# Patient Record
Sex: Male | Born: 1935 | Race: White | Hispanic: No | Smoking: Former smoker
Health system: Southern US, Community
[De-identification: ages and names within clinical notes are randomized; demographics above are authoritative.]

## PROBLEM LIST (undated history)

## (undated) DIAGNOSIS — H3530 Unspecified macular degeneration: Secondary | ICD-10-CM

## (undated) DIAGNOSIS — E785 Hyperlipidemia, unspecified: Secondary | ICD-10-CM

## (undated) DIAGNOSIS — C801 Malignant (primary) neoplasm, unspecified: Secondary | ICD-10-CM

## (undated) DIAGNOSIS — N390 Urinary tract infection, site not specified: Secondary | ICD-10-CM

## (undated) DIAGNOSIS — I10 Essential (primary) hypertension: Secondary | ICD-10-CM

## (undated) DIAGNOSIS — R3129 Other microscopic hematuria: Secondary | ICD-10-CM

## (undated) DIAGNOSIS — R972 Elevated prostate specific antigen [PSA]: Secondary | ICD-10-CM

## (undated) DIAGNOSIS — G459 Transient cerebral ischemic attack, unspecified: Secondary | ICD-10-CM

## (undated) DIAGNOSIS — I4891 Unspecified atrial fibrillation: Secondary | ICD-10-CM

## (undated) DIAGNOSIS — M1990 Unspecified osteoarthritis, unspecified site: Secondary | ICD-10-CM

## (undated) DIAGNOSIS — N400 Enlarged prostate without lower urinary tract symptoms: Secondary | ICD-10-CM

## (undated) DIAGNOSIS — B029 Zoster without complications: Secondary | ICD-10-CM

## (undated) HISTORY — DX: Other microscopic hematuria: R31.29

## (undated) HISTORY — DX: Urinary tract infection, site not specified: N39.0

## (undated) HISTORY — PX: APPENDECTOMY: SHX54

## (undated) HISTORY — DX: Unspecified macular degeneration: H35.30

## (undated) HISTORY — PX: CHOLECYSTECTOMY: SHX55

## (undated) HISTORY — PX: JOINT REPLACEMENT: SHX530

## (undated) HISTORY — DX: Elevated prostate specific antigen (PSA): R97.20

## (undated) HISTORY — DX: Essential (primary) hypertension: I10

## (undated) HISTORY — PX: EYE SURGERY: SHX253

## (undated) HISTORY — DX: Benign prostatic hyperplasia without lower urinary tract symptoms: N40.0

## (undated) HISTORY — DX: Zoster without complications: B02.9

## (undated) HISTORY — DX: Hyperlipidemia, unspecified: E78.5

---

## 1978-07-05 HISTORY — PX: COLON SURGERY: SHX602

## 1997-07-05 HISTORY — PX: TOTAL HIP ARTHROPLASTY: SHX124

## 2004-12-10 ENCOUNTER — Ambulatory Visit: Payer: Self-pay | Admitting: Unknown Physician Specialty

## 2010-04-01 ENCOUNTER — Ambulatory Visit: Payer: Self-pay | Admitting: Unknown Physician Specialty

## 2014-05-31 ENCOUNTER — Emergency Department: Payer: Self-pay | Admitting: Emergency Medicine

## 2014-06-12 DIAGNOSIS — E7800 Pure hypercholesterolemia, unspecified: Secondary | ICD-10-CM | POA: Insufficient documentation

## 2014-06-26 DIAGNOSIS — I272 Pulmonary hypertension, unspecified: Secondary | ICD-10-CM | POA: Insufficient documentation

## 2014-06-26 DIAGNOSIS — B0229 Other postherpetic nervous system involvement: Secondary | ICD-10-CM | POA: Insufficient documentation

## 2014-06-26 DIAGNOSIS — I482 Chronic atrial fibrillation, unspecified: Secondary | ICD-10-CM | POA: Insufficient documentation

## 2014-10-31 DIAGNOSIS — I10 Essential (primary) hypertension: Secondary | ICD-10-CM | POA: Insufficient documentation

## 2014-12-18 ENCOUNTER — Encounter: Payer: Self-pay | Admitting: *Deleted

## 2014-12-18 ENCOUNTER — Ambulatory Visit: Payer: Medicare Other

## 2014-12-18 ENCOUNTER — Other Ambulatory Visit: Payer: Self-pay | Admitting: *Deleted

## 2014-12-18 DIAGNOSIS — R310 Gross hematuria: Secondary | ICD-10-CM

## 2014-12-18 LAB — MICROSCOPIC EXAMINATION: BACTERIA UA: NONE SEEN

## 2014-12-18 LAB — URINALYSIS, COMPLETE
BILIRUBIN UA: NEGATIVE
Glucose, UA: NEGATIVE
Ketones, UA: NEGATIVE
Nitrite, UA: NEGATIVE
Specific Gravity, UA: 1.01 (ref 1.005–1.030)
Urobilinogen, Ur: 0.2 mg/dL (ref 0.2–1.0)
pH, UA: 7 (ref 5.0–7.5)

## 2014-12-19 ENCOUNTER — Telehealth: Payer: Self-pay

## 2014-12-19 NOTE — Telephone Encounter (Signed)
-----   Message from Nori Riis, PA-C sent at 12/19/2014  8:53 AM EDT ----- Patient continues to have blood in urine.  He needs to have a CT Urogram.  Please explain to patient the causes of blood in the urine are as follows: stones, BPH, UTI's, damage to the urinary tract and/or cancer and that the CT Urogram is with contrast material and that in rare instances, an allergic reaction can be serious and even life threatening with the injection of contrast material.   Please confirm that his is not on metformin, allergic to contrast, iodine or seafood.

## 2014-12-19 NOTE — Telephone Encounter (Signed)
LMOM

## 2014-12-19 NOTE — Telephone Encounter (Signed)
Manuel Osborn this needs to be put in to the referrals so that it will generate an order in the queue.

## 2014-12-19 NOTE — Telephone Encounter (Signed)
Spoke with pt and read him Shannon's message. Pt agreed to have CT urogram.  Could you please set up CT

## 2014-12-20 ENCOUNTER — Other Ambulatory Visit: Payer: Self-pay | Admitting: Urology

## 2014-12-20 DIAGNOSIS — R3129 Other microscopic hematuria: Secondary | ICD-10-CM

## 2014-12-20 NOTE — Telephone Encounter (Signed)
Orders are placed 

## 2014-12-25 ENCOUNTER — Ambulatory Visit
Admission: RE | Admit: 2014-12-25 | Discharge: 2014-12-25 | Disposition: A | Discharge status: Home / Self Care | Payer: Medicare Other | Source: Ambulatory Visit | Attending: Urology | Admitting: Urology

## 2014-12-25 DIAGNOSIS — K5730 Diverticulosis of large intestine without perforation or abscess without bleeding: Secondary | ICD-10-CM | POA: Insufficient documentation

## 2014-12-25 DIAGNOSIS — R3129 Other microscopic hematuria: Secondary | ICD-10-CM

## 2014-12-25 DIAGNOSIS — R312 Other microscopic hematuria: Secondary | ICD-10-CM | POA: Insufficient documentation

## 2014-12-25 DIAGNOSIS — N400 Enlarged prostate without lower urinary tract symptoms: Secondary | ICD-10-CM | POA: Insufficient documentation

## 2014-12-25 HISTORY — DX: Malignant (primary) neoplasm, unspecified: C80.1

## 2014-12-25 MED ORDER — IOHEXOL 300 MG/ML  SOLN
115.0000 mL | Freq: Once | INTRAMUSCULAR | Status: AC | PRN
  Administered 2014-12-25: 115 mL via INTRAVENOUS

## 2015-01-03 ENCOUNTER — Encounter: Payer: Self-pay | Admitting: Urology

## 2015-01-03 ENCOUNTER — Ambulatory Visit (INDEPENDENT_AMBULATORY_CARE_PROVIDER_SITE_OTHER): Payer: Medicare Other | Admitting: Urology

## 2015-01-03 VITALS — BP 165/89 | HR 89 | Ht 70.0 in | Wt 165.3 lb

## 2015-01-03 DIAGNOSIS — N401 Enlarged prostate with lower urinary tract symptoms: Secondary | ICD-10-CM

## 2015-01-03 DIAGNOSIS — N138 Other obstructive and reflux uropathy: Secondary | ICD-10-CM

## 2015-01-03 DIAGNOSIS — R312 Other microscopic hematuria: Secondary | ICD-10-CM

## 2015-01-03 DIAGNOSIS — R3129 Other microscopic hematuria: Secondary | ICD-10-CM

## 2015-01-03 LAB — URINALYSIS, COMPLETE
Bilirubin, UA: NEGATIVE
Glucose, UA: NEGATIVE
Ketones, UA: NEGATIVE
LEUKOCYTES UA: NEGATIVE
Nitrite, UA: NEGATIVE
PH UA: 7 (ref 5.0–7.5)
Protein, UA: NEGATIVE
Specific Gravity, UA: 1.01 (ref 1.005–1.030)
UUROB: 0.2 mg/dL (ref 0.2–1.0)

## 2015-01-03 LAB — MICROSCOPIC EXAMINATION: Bacteria, UA: NONE SEEN

## 2015-01-03 NOTE — Progress Notes (Signed)
01/03/2015 12:35 PM   Manuel Osborn 1926/07/30 [id number]  Referring provider: Madelyn Brunner, MD [place], [place] [address]  Chief Complaint  Patient presents with  . Results    CT    HPI: Manuel Osborn is an 81 year old white male who had 2 instances of microscopic hematuria in our office.  He had 3-10 RBC's per high power field on 11/26/2014 and 3-10 RBC's per power field on 12/18/2014.  He did not have any gross hematuria.  He denied any flank pain, abdominal pain, back pain and/or suprapubic pain.    Patient underwent CT urogram on 12/25/2014 for further evaluation.  There was no explanation for the hematuria found on the CT urogram.  Patient was found to have a markedly enlarged prostate gland with mild circumferential thickening of the urinary bladder wall. He also had extensive atherosclerosis and chronic colonic diverticulosis.  I reviewed the films with the patient and the distal right ureter was not opacified during the study.  PNH: Past Medical History  Diagnosis Date  . Hyperlipidemia   . Shingles   . Hematuria, microscopic   . BPH (benign prostatic hyperplasia)   . Urinary tract infection   . Elevated PSA   . Hypertension   . Cancer     colon cancer 1988    Surgical History: Past Surgical History  Procedure Laterality Date  . Total hip arthroplasty Right 1999  . Colon surgery  1980    Home Medications:    Medication List       This list is accurate as of: 01/03/15 12:35 PM.  Always use your most recent med list.               acetaminophen 325 MG tablet  Commonly known as:  TYLENOL  Take by mouth.     diltiazem 120 MG 24 hr capsule  Commonly known as:  CARDIZEM CD  Take by mouth.     ELIQUIS 5 MG Tabs tablet  Generic drug:  apixaban  Take by mouth.     finasteride 5 MG tablet  Commonly known as:  PROSCAR  Take by mouth.     gabapentin 300 MG capsule  Commonly known as:  NEURONTIN  Take by mouth.     simvastatin 10 MG tablet  Commonly known as:  ZOCOR  TAKE ONE TABLET BY MOUTH AT BEDTIME.     tamsulosin 0.4 MG Caps capsule  Commonly known as:  FLOMAX  Take by mouth.        Allergies:  Allergies  Allergen Reactions  . Diphenhydramine Other (See Comments)    Family History: Family History  Problem Relation Age of Onset  . Prostate cancer Neg Hx   . Bladder Cancer Neg Hx     Social History:  reports that he has never smoked. He does not have any smokeless tobacco history on file. He reports that he does not drink alcohol or use illicit drugs.  ROS: Urological Symptom Review  Patient is experiencing the following symptoms: Blood in urine   Review of Systems  Gastrointestinal (upper)  : Negative for upper GI symptoms  Gastrointestinal (lower) : Negative for lower GI symptoms  Constitutional : Negative for symptoms  Skin: Negative for skin symptoms  Eyes: Negative for eye symptoms  Ear/Nose/Throat : Negative for Ear/Nose/Throat symptoms  Hematologic/Lymphatic: Negative for Hematologic/Lymphatic symptoms  Cardiovascular : Negative for cardiovascular symptoms  Respiratory : Negative for respiratory symptoms  Endocrine: Negative for endocrine symptoms  Musculoskeletal: Joint pain  Neurological: Negative for neurological symptoms  Psychologic: Negative for psychiatric symptoms   Physical Exam: BP 165/89 mmHg  Pulse 89  Ht 5\' 10"  (1.778 m)  Wt 165 lb 4.8 oz (74.98 kg)  BMI 23.72 kg/m2   Laboratory Data: Results for orders placed or performed in visit on 01/03/15  CULTURE, URINE COMPREHENSIVE  Result Value Ref Range   Result 1 Gram negative rods (A)   Microscopic Examination  Result Value Ref Range   WBC, UA 0-5 0 -  5 /hpf   RBC, UA 3-10 (A) 0 -  2 /hpf   Epithelial Cells (non renal) 0-10 0 - 10 /hpf   Bacteria, UA None seen None seen/Few  Urinalysis, Complete  Result Value Ref Range   Specific Gravity, UA 1.010 1.005 - 1.030    pH, UA 7.0 5.0 - 7.5   Color, UA Yellow Yellow   Appearance Ur Clear Clear   Leukocytes, UA Negative Negative   Protein, UA Negative Negative/Trace   Glucose, UA Negative Negative   Ketones, UA Negative Negative   RBC, UA Trace (A) Negative   Bilirubin, UA Negative Negative   Urobilinogen, Ur 0.2 0.2 - 1.0 mg/dL   Nitrite, UA Negative Negative   Microscopic Examination See below:    No results found for: WBC, HGB, HCT, MCV, PLT  No results found for: CREATININE  No results found for: PSA  No results found for: TESTOSTERONE  No results found for: HGBA1C  Urinalysis    Component Value Date/Time   GLUCOSEU Negative 12/18/2014 1127   BILIRUBINUR Negative 12/18/2014 1127   NITRITE Negative 12/18/2014 1127   LEUKOCYTESUR Trace* 12/18/2014 1127    Pertinent Imaging: CLINICAL DATA: 81 year old male with microhematuria. Remote history of colon cancer in 1988 status post partial colectomy.  EXAM: CT ABDOMEN AND PELVIS WITHOUT AND WITH CONTRAST  TECHNIQUE: Multi detector CT imaging of the abdomen and pelvis was performed following the standard protocol before and following the bolus administration of intravenous contrast.  CONTRAST: 115 mL OMNIPAQUE IOHEXOL 300 MG/ML SOLN  COMPARISON: No priors.  FINDINGS: Lower chest: Unremarkable.  Hepatobiliary: 9 mm low to intermediate attenuation lesion in segment 8 of the liver is too small to characterize, but favored to represent a small proteinaceous cyst. No other larger suspicious appearing cystic or solid hepatic lesion. No intra or extrahepatic biliary ductal dilatation. Status post cholecystectomy.  Pancreas: No pancreatic mass. No pancreatic ductal dilatation. No pancreatic or peripancreatic fluid or inflammatory changes.  Spleen: Unremarkable.  Adrenals/Urinary Tract: No calcifications identified within the collecting system of either kidney, along the course of either ureter, or within the lumen of  the urinary bladder. No hydroureteronephrosis or perinephric stranding to suggest urinary tract obstruction at this time. Linear calcifications in the renal hila bilaterally appear to be atherosclerotic. No suspicious renal lesions. Post contrast delayed images demonstrate no filling defect within the collecting system of either kidney, along the course of either ureter, or within the well opacified portion of the urinary bladder to strongly suggest the presence of a urothelial neoplasm at this time. Mild circumferential thickening of the urinary bladder wall, suggestive of potential bladder outlet obstruction. Bilateral adrenal glands are normal.  Stomach/Bowel: Normal appearance of the stomach. No pathologic dilatation of small bowel or colon. Numerous colonic diverticula are noted, particularly in the sigmoid colon, without surrounding inflammatory changes to suggest an acute diverticulitis at this time. Status post right hemicolectomy.  Vascular/Lymphatic: Atherosclerosis throughout the abdominal and pelvic vasculature, without  evidence of aneurysm. No lymphadenopathy noted in the abdomen or pelvis.  Reproductive: Prostate gland is markedly enlarged measuring 6.7 x 6.1 cm, with severe median lobe hypertrophy.  Other: No significant volume of ascites. No pneumoperitoneum.  Musculoskeletal: Status post right total hip arthroplasty. There are no aggressive appearing lytic or blastic lesions noted in the visualized portions of the skeleton.  IMPRESSION: 1. No explanation for the patient's history of microhematuria. Specifically, no urinary tract calculi. 2. However, the patient's prostate gland is markedly enlarged, with severe median lobe hypertrophy, as above. There appears to be some mild circumferential thickening of the urinary bladder wall, suggesting chronic bladder outlet obstruction. 3. Extensive atherosclerosis. 4. Colonic diverticulosis without evidence of  acute diverticulitis at this time. 5. Additional incidental findings, as above.   Electronically Signed  By: Elsworth Soho BM.D.  On: 12/25/2014 15:22  Assessment & Plan:    1. Microscopic hematuria:   Patient continues to have microscopic hematuria in his urine.  He denies any gross hematuria. I discussed with the patient the finding of the CT urogram. We reviewed his options, such as: postponing further workup until he experiences gross hematuria, undergoing cystoscopy in the office with urine cytology or undergoing cystoscopy with retrogrades with possible biopsy and possible ureteral stent in the OR. Patient would like to pursue a full workup and would like to be scheduled for cystoscopy with retrogrades.  I described to the patient how the procedure is performed and the risk associated with the procedure, such as: infection, bleeding, uncomfortableness for the first few days after the procedure, the possibility of a biopsy of an area of concern in the ureters or bladder and the possibility of a stent placement.  irritation of the bladder causing urinary frequency, blood in the urine, pain in the back when passing urine, stent displacement and/or infection. I also explained the risks of general anesthesia, such as: MI, CVA, paralysis, coma and/or death.  Patient is on Liquids for a. Fib and will need cardiac clearance prior to the procedure.    - Urinalysis, Complete  2. BPH with LUTS;   Patient's prostate was found to be markedly enlarged on CT Urogram.  IPSS score was 6/2 on 11/26/2014.  His last PSA was 2.9 ng/mL on 06/20/2014.  Patient will have a repeat IPSS score and DRE in one year.   No Follow-up on file.  Zara Council, [facility] [address] [place], [place] [address] [phone]

## 2015-01-05 DIAGNOSIS — R3129 Other microscopic hematuria: Secondary | ICD-10-CM | POA: Insufficient documentation

## 2015-01-05 DIAGNOSIS — N138 Other obstructive and reflux uropathy: Secondary | ICD-10-CM | POA: Insufficient documentation

## 2015-01-05 DIAGNOSIS — N401 Enlarged prostate with lower urinary tract symptoms: Secondary | ICD-10-CM

## 2015-01-06 LAB — CULTURE, URINE COMPREHENSIVE

## 2015-01-07 NOTE — Telephone Encounter (Signed)
Spoke with pt in reference to results. Pt voiced understanding. Cw,lpn

## 2015-01-07 NOTE — Telephone Encounter (Signed)
-----   Message from Nori Riis, PA-C sent at 01/07/2015  8:24 AM EDT ----- Skin contaminate.

## 2015-01-16 ENCOUNTER — Other Ambulatory Visit: Payer: Self-pay | Admitting: Urology

## 2015-01-22 ENCOUNTER — Encounter
Admission: RE | Admit: 2015-01-22 | Discharge: 2015-01-22 | Disposition: A | Discharge status: Home / Self Care | Payer: Medicare Other | Source: Ambulatory Visit | Attending: Urology | Admitting: Urology

## 2015-01-22 ENCOUNTER — Telehealth: Payer: Self-pay | Admitting: Family Medicine

## 2015-01-22 DIAGNOSIS — Z01810 Encounter for preprocedural cardiovascular examination: Secondary | ICD-10-CM | POA: Insufficient documentation

## 2015-01-22 HISTORY — DX: Unspecified atrial fibrillation: I48.91

## 2015-01-22 HISTORY — DX: Unspecified osteoarthritis, unspecified site: M19.90

## 2015-01-22 HISTORY — DX: Transient cerebral ischemic attack, unspecified: G45.9

## 2015-01-22 LAB — CBC
HEMATOCRIT: 45.1 % (ref 40.0–52.0)
Hemoglobin: 15.5 g/dL (ref 13.0–18.0)
MCH: 32.7 pg (ref 26.0–34.0)
MCHC: 34.4 g/dL (ref 32.0–36.0)
MCV: 95.1 fL (ref 80.0–100.0)
PLATELETS: 119 10*3/uL — AB (ref 150–440)
RBC: 4.74 MIL/uL (ref 4.40–5.90)
RDW: 14.3 % (ref 11.5–14.5)
WBC: 8.3 10*3/uL (ref 3.8–10.6)

## 2015-01-22 LAB — BASIC METABOLIC PANEL
Anion gap: 10 (ref 5–15)
BUN: 11 mg/dL (ref 6–20)
CHLORIDE: 104 mmol/L (ref 101–111)
CO2: 29 mmol/L (ref 22–32)
Calcium: 9.1 mg/dL (ref 8.9–10.3)
Creatinine, Ser: 0.74 mg/dL (ref 0.61–1.24)
GFR calc non Af Amer: >60 mL/min (ref >60)
Glucose, Bld: 94 mg/dL (ref 65–99)
Potassium: 3.4 mmol/L — ABNORMAL LOW (ref 3.5–5.1)
SODIUM: 143 mmol/L (ref 135–145)

## 2015-01-22 NOTE — Patient Instructions (Signed)
  Your procedure is scheduled on: January 27, 2015 (Monday) Report to Day Surgery. To find out your arrival time please call [phone] between 1PM - 3PM on January 24, 2015 (Friday).  Remember: Instructions that are not followed completely may result in serious medical risk, up to and including death, or upon the discretion of your surgeon and anesthesiologist your surgery may need to be rescheduled.    __x__ 1. Do not eat food or drink liquids after midnight. No gum chewing or hard candies.     ____ 2. No Alcohol for 24 hours before or after surgery.   ____ 3. Bring all medications with you on the day of surgery if instructed.    __x__ 4. Notify your doctor if there is any change in your medical condition     (cold, fever, infections).     Do not wear jewelry, make-up, hairpins, clips or nail polish.  Do not wear lotions, powders, or perfumes. You may wear deodorant.  Do not shave 48 hours prior to surgery. Men may shave face and neck.  Do not bring valuables to the hospital.    [facility] is not responsible for any belongings or valuables.               Contacts, dentures or bridgework may not be worn into surgery.  Leave your suitcase in the car. After surgery it may be brought to your room.  For patients admitted to the hospital, discharge time is determined by your                treatment team.  Patients discharged the day of surgery will not be allowed to drive home.   Please read over the following fact sheets that you were given:    _x__ Take these medicines the morning of surgery with A SIP OF WATER:    1. Cardizem  2. Gabapentin  3.   4.  5.  6.  ____ Fleet Enema (as directed)   ____ Use CHG Soap as directed  ____ Use inhalers on the day of surgery  ____ Stop metformin 2 days prior to surgery    ____ Take 1/2 of usual insulin dose the night before surgery and none on the morning of surgery.   __x__ Stop Coumadin/Plavix/aspirin on (STOP ELIQUIS ON July 22,  Friday)  ____ Stop Anti-inflammatories on    ____ Stop supplements until after surgery.    ____ Bring C-Pap to the hospital.

## 2015-01-22 NOTE — Telephone Encounter (Signed)
Patient stated you called he is unsure why. I see no messages about a call. He has an appointment today at 1:45 for Pre-Op

## 2015-01-23 NOTE — Pre-Procedure Instructions (Signed)
Cardiac Clearance received from Dr. Saralyn Pilar and stated that patient is at low risk for surgery.

## 2015-01-27 ENCOUNTER — Encounter
Admission: RE | Disposition: A | Discharge status: Home / Self Care | Payer: Self-pay | Source: Ambulatory Visit | Attending: Urology

## 2015-01-27 ENCOUNTER — Ambulatory Visit: Payer: Medicare Other | Admitting: Certified Registered"

## 2015-01-27 ENCOUNTER — Encounter: Payer: Self-pay | Admitting: *Deleted

## 2015-01-27 ENCOUNTER — Ambulatory Visit
Admission: RE | Admit: 2015-01-27 | Discharge: 2015-01-27 | Disposition: A | Discharge status: Home / Self Care | Payer: Medicare Other | Source: Ambulatory Visit | Attending: Urology | Admitting: Urology

## 2015-01-27 DIAGNOSIS — E785 Hyperlipidemia, unspecified: Secondary | ICD-10-CM | POA: Diagnosis not present

## 2015-01-27 DIAGNOSIS — R312 Other microscopic hematuria: Secondary | ICD-10-CM | POA: Insufficient documentation

## 2015-01-27 DIAGNOSIS — Z79899 Other long term (current) drug therapy: Secondary | ICD-10-CM | POA: Diagnosis not present

## 2015-01-27 DIAGNOSIS — N138 Other obstructive and reflux uropathy: Secondary | ICD-10-CM | POA: Diagnosis not present

## 2015-01-27 DIAGNOSIS — N3289 Other specified disorders of bladder: Secondary | ICD-10-CM | POA: Diagnosis not present

## 2015-01-27 DIAGNOSIS — Z96641 Presence of right artificial hip joint: Secondary | ICD-10-CM | POA: Insufficient documentation

## 2015-01-27 DIAGNOSIS — Z87891 Personal history of nicotine dependence: Secondary | ICD-10-CM | POA: Diagnosis not present

## 2015-01-27 DIAGNOSIS — Z7901 Long term (current) use of anticoagulants: Secondary | ICD-10-CM | POA: Insufficient documentation

## 2015-01-27 DIAGNOSIS — I10 Essential (primary) hypertension: Secondary | ICD-10-CM | POA: Insufficient documentation

## 2015-01-27 DIAGNOSIS — N401 Enlarged prostate with lower urinary tract symptoms: Secondary | ICD-10-CM | POA: Insufficient documentation

## 2015-01-27 DIAGNOSIS — R3129 Other microscopic hematuria: Secondary | ICD-10-CM

## 2015-01-27 DIAGNOSIS — Z85038 Personal history of other malignant neoplasm of large intestine: Secondary | ICD-10-CM | POA: Diagnosis not present

## 2015-01-27 DIAGNOSIS — R311 Benign essential microscopic hematuria: Secondary | ICD-10-CM | POA: Diagnosis not present

## 2015-01-27 DIAGNOSIS — N400 Enlarged prostate without lower urinary tract symptoms: Secondary | ICD-10-CM | POA: Diagnosis not present

## 2015-01-27 DIAGNOSIS — R319 Hematuria, unspecified: Secondary | ICD-10-CM | POA: Diagnosis present

## 2015-01-27 HISTORY — PX: CYSTOSCOPY W/ RETROGRADES: SHX1426

## 2015-01-27 SURGERY — CYSTOSCOPY, WITH RETROGRADE PYELOGRAM
Anesthesia: General | Wound class: Clean Contaminated

## 2015-01-27 MED ORDER — FAMOTIDINE 20 MG PO TABS
ORAL_TABLET | ORAL | Status: AC
Start: 2015-01-27 — End: 2015-01-27
  Administered 2015-01-27: 20 mg via ORAL
  Filled 2015-01-27: qty 1

## 2015-01-27 MED ORDER — CEFAZOLIN SODIUM 1-5 GM-% IV SOLN
INTRAVENOUS | Status: AC
Start: 2015-01-27 — End: 2015-01-27
  Administered 2015-01-27: 1 g via INTRAVENOUS
  Filled 2015-01-27: qty 50

## 2015-01-27 MED ORDER — MIDAZOLAM HCL 2 MG/2ML IJ SOLN
INTRAMUSCULAR | Status: DC | PRN
  Administered 2015-01-27: 1 mg via INTRAVENOUS

## 2015-01-27 MED ORDER — FENTANYL CITRATE (PF) 100 MCG/2ML IJ SOLN
INTRAMUSCULAR | Status: DC | PRN
  Administered 2015-01-27: 25 ug via INTRAVENOUS

## 2015-01-27 MED ORDER — ONDANSETRON HCL 4 MG/2ML IJ SOLN
INTRAMUSCULAR | Status: DC | PRN
  Administered 2015-01-27: 4 mg via INTRAVENOUS

## 2015-01-27 MED ORDER — LACTATED RINGERS IV SOLN
INTRAVENOUS | Status: DC
  Administered 2015-01-27: 08:00:00 via INTRAVENOUS

## 2015-01-27 MED ORDER — PROPOFOL 10 MG/ML IV BOLUS
INTRAVENOUS | Status: DC | PRN
  Administered 2015-01-27: 40 mg via INTRAVENOUS

## 2015-01-27 MED ORDER — PROPOFOL INFUSION 10 MG/ML OPTIME
INTRAVENOUS | Status: DC | PRN
  Administered 2015-01-27: 50 ug/kg/min via INTRAVENOUS

## 2015-01-27 MED ORDER — FAMOTIDINE 20 MG PO TABS
20.0000 mg | ORAL_TABLET | Freq: Once | ORAL | Status: AC
  Administered 2015-01-27: 20 mg via ORAL

## 2015-01-27 MED ORDER — SODIUM CHLORIDE 0.9 % IR SOLN
Status: DC | PRN
  Administered 2015-01-27: 4500 mL

## 2015-01-27 MED ORDER — LIDOCAINE HCL (CARDIAC) 20 MG/ML IV SOLN
INTRAVENOUS | Status: DC | PRN
  Administered 2015-01-27: 60 mg via INTRAVENOUS

## 2015-01-27 MED ORDER — CEFAZOLIN SODIUM 1-5 GM-% IV SOLN
1.0000 g | Freq: Once | INTRAVENOUS | Status: AC
  Administered 2015-01-27: 1 g via INTRAVENOUS

## 2015-01-27 SURGICAL SUPPLY — 23 items
BAG DRAIN CYSTO-URO LG1000N (MISCELLANEOUS) IMPLANT
CATH URETL 5X70 OPEN END (CATHETERS) ×3 IMPLANT
CONRAY 43 FOR UROLOGY 50M (MISCELLANEOUS) ×3 IMPLANT
CORD URO TURP 10FT (MISCELLANEOUS) IMPLANT
GLOVE BIO SURGEON STRL SZ 6.5 (GLOVE) ×2 IMPLANT
GLOVE BIO SURGEON STRL SZ7 (GLOVE) ×3 IMPLANT
GLOVE BIO SURGEONS STRL SZ 6.5 (GLOVE) ×1
GOWN STRL REUS W/ TWL LRG LVL3 (GOWN DISPOSABLE) ×2 IMPLANT
GOWN STRL REUS W/TWL LRG LVL3 (GOWN DISPOSABLE) ×4
JELLY LUB 2OZ STRL (MISCELLANEOUS) ×2
JELLY LUBE 2OZ STRL (MISCELLANEOUS) ×1 IMPLANT
KIT RM TURNOVER CYSTO AR (KITS) ×3 IMPLANT
PACK CYSTO AR (MISCELLANEOUS) ×3 IMPLANT
PAD GROUND ADULT SPLIT (MISCELLANEOUS) ×3 IMPLANT
PREP PVP WINGED SPONGE (MISCELLANEOUS) ×3 IMPLANT
PUMP SINGLE ACTION SAP (PUMP) IMPLANT
SENSORWIRE 0.038 NOT ANGLED (WIRE) ×3
SET CYSTO W/LG BORE CLAMP LF (SET/KITS/TRAYS/PACK) ×3 IMPLANT
SOL .9 NS 3000ML IRR  AL (IV SOLUTION) ×2
SOL .9 NS 3000ML IRR UROMATIC (IV SOLUTION) ×1 IMPLANT
WATER STERILE IRR 1000ML POUR (IV SOLUTION) ×3 IMPLANT
WATER STERILE IRR 3000ML UROMA (IV SOLUTION) IMPLANT
WIRE SENSOR 0.038 NOT ANGLED (WIRE) ×1 IMPLANT

## 2015-01-27 NOTE — Op Note (Signed)
Date of procedure: 01/27/2015  Preoperative diagnosis:  1. Hematuria 2. BPH   Postoperative diagnosis:  1. Same as above   Procedure: 1. cystoscopy  Surgeon: Hollice Espy, MD  Anesthesia: General  Complications: None  Intraoperative findings: massively enlarged prostate with some bullous edema on the mucosal surface as well as friable prostatic vessels. Heavily trabeculated bladder. No bladder tumors or pathology.  Unable to identify trigone given massively enlarged median lobe.  EBL: minimal  Specimens: none  Drains: none  Indication: Manuel Osborn is a 81 y.o. patient with microscopic hematuria and BPH.Marland Kitchen  After reviewing the management options for treatment, he elected to proceed with the above surgical procedure(s). We have discussed the potential benefits and risks of the procedure, side effects of the proposed treatment, the likelihood of the patient achieving the goals of the procedure, and any potential problems that might occur during the procedure or recuperation. Informed consent has been obtained.  Description of procedure:  The patient was taken to the operating room and general anesthesia was induced.  The patient was placed in the dorsal lithotomy position, prepped and draped in the usual sterile fashion, and preoperative antibiotics were administered. A preoperative time-out was performed.   A rigid 24 French cystoscope was advanced per urethra into the bladder. This was somewhat difficult given the massively enlarged size of his prostate and very elevated bladder neck. He had a significant intravesical median lobe with some bullous edema on the mucosal surface of the prostate. The prostate was also noted to be relatively friable with bleeding noted with instrumentation. The bladder mucosa was then carefully inspected and there was no evidence of bladder tumors or bladder stones. Unfortunately, I was not able to identify the right UO given the very large  obstructing median lobe. Of note, on CT urogram, bilateral ureters were completely opacified other than a very small portion of the distal right ureter somewhat obstructed by the patient's artificial hip. Given the relatively low risk of pathology in the very distal ureter as well as concern for development of significant hematuria with continued manipulation, I did elect not to proceed with right retrograde is planned. Both the 30 and 70 lenses were used. At the end of the case, the bladder was drained. The scope was removed and the patient was repositioned in the supine position, reversed from anesthesia, and taken to the PACU in stable condition.  Hollice Espy, M.D.

## 2015-01-27 NOTE — Anesthesia Preprocedure Evaluation (Addendum)
Anesthesia Evaluation  Patient identified by MRN, date of birth, ID band Patient awake    Reviewed: Allergy & Precautions, H&P , NPO status , Patient's Chart, lab work & pertinent test results, reviewed documented beta blocker date and time   Airway Mallampati: II  TM Distance: >3 FB Neck ROM: full    Dental no notable dental hx.    Pulmonary neg pulmonary ROS, former smoker,  breath sounds clear to auscultation  Pulmonary exam normal       Cardiovascular Exercise Tolerance: Good hypertension, negative cardio ROS  Rhythm:regular Rate:Normal     Neuro/Psych negative neurological ROS  negative psych ROS   GI/Hepatic negative GI ROS, Neg liver ROS,   Endo/Other  negative endocrine ROS  Renal/GU negative Renal ROS  negative genitourinary   Musculoskeletal   Abdominal   Peds  Hematology negative hematology ROS (+)   Anesthesia Other Findings   Reproductive/Obstetrics negative OB ROS                             Anesthesia Physical Anesthesia Plan  ASA: II  Anesthesia Plan: General   Post-op Pain Management:    Induction:   Airway Management Planned:   Additional Equipment:   Intra-op Plan:   Post-operative Plan:   Informed Consent: I have reviewed the patients History and Physical, chart, labs and discussed the procedure including the risks, benefits and alternatives for the proposed anesthesia with the patient or authorized representative who has indicated his/her understanding and acceptance.   Dental Advisory Given  Plan Discussed with: CRNA  Anesthesia Plan Comments:         Anesthesia Quick Evaluation

## 2015-01-27 NOTE — Anesthesia Postprocedure Evaluation (Signed)
  Anesthesia Post-op Note  Patient: Manuel Osborn  Procedure(s) Performed: Procedure(s): CYSTOSCOPY  (N/A)  Anesthesia type:General  Patient location: PACU  Post pain: Pain level controlled  Post assessment: Post-op Vital signs reviewed, Patient's Cardiovascular Status Stable, Respiratory Function Stable, Patent Airway and No signs of Nausea or vomiting  Post vital signs: Reviewed and stable  Last Vitals:  Filed Vitals:   01/27/15 0914  BP: 110/70  Pulse: 75  Temp: 36.5 C  Resp: 16    Level of consciousness: awake, alert  and patient cooperative  Complications: No apparent anesthesia complications

## 2015-01-27 NOTE — Interval H&P Note (Signed)
History and Physical Interval Note:  01/27/2015 8:29 AM  Manuel Osborn  has presented today for surgery, with the diagnosis of B  The various methods of treatment have been discussed with the patient and family. After consideration of risks, benefits and other options for treatment, the patient has consented to  Procedure(s): CYSTOSCOPY WITH RETROGRADE PYELOGRAM (Bilateral) CYSTOSCOPY WITH BIOPSY (N/A) as a surgical intervention .  The patient's history has been reviewed, patient examined, no change in status, stable for surgery.  I have reviewed the patient's chart and labs.  Questions were answered to the patient's satisfaction.    RRR CTAB  Hollice Espy

## 2015-01-27 NOTE — Transfer of Care (Signed)
Immediate Anesthesia Transfer of Care Note  Patient: Manuel Osborn  Procedure(s) Performed: Procedure(s): CYSTOSCOPY  (N/A)  Patient Location: PACU  Anesthesia Type:General  Level of Consciousness: awake, alert , oriented and patient cooperative  Airway & Oxygen Therapy: Patient Spontanous Breathing and Patient connected to face mask oxygen  Post-op Assessment: Report given to RN, Post -op Vital signs reviewed and stable and Patient moving all extremities X 4  Post vital signs: Reviewed and stable  Last Vitals:  Filed Vitals:   01/27/15 0914  BP: 110/70  Pulse: 75  Temp: 36.5 C  Resp: 16    Complications: No apparent anesthesia complications

## 2015-01-27 NOTE — Discharge Instructions (Signed)
Cystoscopy, Care After Refer to this sheet in the next few weeks. These instructions provide you with information on caring for yourself after your procedure. Your caregiver may also give you more specific instructions. Your treatment has been planned according to current medical practices, but problems sometimes occur. Call your caregiver if you have any problems or questions after your procedure. HOME CARE INSTRUCTIONS  Things you can do to ease any discomfort after your procedure include:  Drinking enough water and fluids to keep your urine clear or pale yellow.  Taking a warm bath to relieve any burning feelings. SEEK IMMEDIATE MEDICAL CARE IF:   You have an increase in blood in your urine.  You notice blood clots in your urine.  You have difficulty passing urine.  You have the chills.  You have abdominal pain.  You have a fever or persistent symptoms for more than 2-3 days.  You have a fever and your symptoms suddenly get worse. MAKE SURE YOU:   Understand these instructions.  Will watch your condition.  Will get help right away if you are not doing well or get worse. Document Released: 01/08/2005 Document Revised: 02/21/2013 Document Reviewed: 12/13/2011 [facility] Patient Information 2015 [place], [place]. This information is not intended to replace advice given to you by your health care provider. Make sure you discuss any questions you have with your health care provider.  AMBULATORY SURGERY  DISCHARGE INSTRUCTIONS  1) The drugs that you were given will stay in your system until tomorrow so for the next 24 hours you should not: A) Drive an automobile B) Make any legal decisions C) Drink any alcoholic beverage  2) You may resume regular meals tomorrow.  Today it is better to start with liquids and gradually work up to solid foods.  You may eat anything you prefer, but it is better to start with liquids, then soup and crackers, and gradually work up to solid  foods.  3) Please notify your doctor immediately if you have any unusual bleeding, trouble breathing, redness and pain at the surgery site, drainage, fever, or pain not relieved by medication.  4) Additional Instructions:   Please contact your physician with any problems or Same Day Surgery at [phone], Monday through Friday 6 am to 4 pm, or [place] at [facility] number at [phone].

## 2015-01-27 NOTE — H&P (View-Only) (Signed)
01/03/2015 12:35 PM   Manuel Osborn [birth year] [id number]  Referring provider: Madelyn Brunner, MD [place], [place] [address]  Chief Complaint  Patient presents with  . Results    CT    HPI: Manuel Osborn is an 81 year old white male who had 2 instances of microscopic hematuria in our office.  He had 3-10 RBC's per high power field on 11/26/2014 and 3-10 RBC's per power field on 12/18/2014.  He did not have any gross hematuria.  He denied any flank pain, abdominal pain, back pain and/or suprapubic pain.    Patient underwent CT urogram on 12/25/2014 for further evaluation.  There was no explanation for the hematuria found on the CT urogram.  Patient was found to have a markedly enlarged prostate gland with mild circumferential thickening of the urinary bladder wall. He also had extensive atherosclerosis and chronic colonic diverticulosis.  I reviewed the films with the patient and the distal right ureter was not opacified during the study.  PNH: Past Medical History  Diagnosis Date  . Hyperlipidemia   . Shingles   . Hematuria, microscopic   . BPH (benign prostatic hyperplasia)   . Urinary tract infection   . Elevated PSA   . Hypertension   . Cancer     colon cancer 1988    Surgical History: Past Surgical History  Procedure Laterality Date  . Total hip arthroplasty Right 1999  . Colon surgery  1980    Home Medications:    Medication List       This list is accurate as of: 01/03/15 12:35 PM.  Always use your most recent med list.               acetaminophen 325 MG tablet  Commonly known as:  TYLENOL  Take by mouth.     diltiazem 120 MG 24 hr capsule  Commonly known as:  CARDIZEM CD  Take by mouth.     ELIQUIS 5 MG Tabs tablet  Generic drug:  apixaban  Take by mouth.     finasteride 5 MG tablet  Commonly known as:  PROSCAR  Take by mouth.     gabapentin 300 MG capsule  Commonly known as:  NEURONTIN  Take by mouth.     simvastatin 10 MG tablet  Commonly known as:  ZOCOR  TAKE ONE TABLET BY MOUTH AT BEDTIME.     tamsulosin 0.4 MG Caps capsule  Commonly known as:  FLOMAX  Take by mouth.        Allergies:  Allergies  Allergen Reactions  . Diphenhydramine Other (See Comments)    Family History: Family History  Problem Relation Age of Onset  . Prostate cancer Neg Hx   . Bladder Cancer Neg Hx     Social History:  reports that he has never smoked. He does not have any smokeless tobacco history on file. He reports that he does not drink alcohol or use illicit drugs.  ROS: Urological Symptom Review  Patient is experiencing the following symptoms: Blood in urine   Review of Systems  Gastrointestinal (upper)  : Negative for upper GI symptoms  Gastrointestinal (lower) : Negative for lower GI symptoms  Constitutional : Negative for symptoms  Skin: Negative for skin symptoms  Eyes: Negative for eye symptoms  Ear/Nose/Throat : Negative for Ear/Nose/Throat symptoms  Hematologic/Lymphatic: Negative for Hematologic/Lymphatic symptoms  Cardiovascular : Negative for cardiovascular symptoms  Respiratory : Negative for respiratory symptoms  Endocrine: Negative for endocrine symptoms  Musculoskeletal: Joint pain  Neurological: Negative for neurological symptoms  Psychologic: Negative for psychiatric symptoms   Physical Exam: BP 165/89 mmHg  Pulse 89  Ht 5\' 10"  (1.778 m)  Wt 165 lb 4.8 oz (74.98 kg)  BMI 23.72 kg/m2   Laboratory Data: Results for orders placed or performed in visit on 01/03/15  CULTURE, URINE COMPREHENSIVE  Result Value Ref Range   Result 1 Gram negative rods (A)   Microscopic Examination  Result Value Ref Range   WBC, UA 0-5 0 -  5 /hpf   RBC, UA 3-10 (A) 0 -  2 /hpf   Epithelial Cells (non renal) 0-10 0 - 10 /hpf   Bacteria, UA None seen None seen/Few  Urinalysis, Complete  Result Value Ref Range   Specific Gravity, UA 1.010 1.005 - 1.030    pH, UA 7.0 5.0 - 7.5   Color, UA Yellow Yellow   Appearance Ur Clear Clear   Leukocytes, UA Negative Negative   Protein, UA Negative Negative/Trace   Glucose, UA Negative Negative   Ketones, UA Negative Negative   RBC, UA Trace (A) Negative   Bilirubin, UA Negative Negative   Urobilinogen, Ur 0.2 0.2 - 1.0 mg/dL   Nitrite, UA Negative Negative   Microscopic Examination See below:    No results found for: WBC, HGB, HCT, MCV, PLT  No results found for: CREATININE  No results found for: PSA  No results found for: TESTOSTERONE  No results found for: HGBA1C  Urinalysis    Component Value Date/Time   GLUCOSEU Negative 12/18/2014 1127   BILIRUBINUR Negative 12/18/2014 1127   NITRITE Negative 12/18/2014 1127   LEUKOCYTESUR Trace* 12/18/2014 1127    Pertinent Imaging: CLINICAL DATA: 81 year old male with microhematuria. Remote history of colon cancer in 1988 status post partial colectomy.  EXAM: CT ABDOMEN AND PELVIS WITHOUT AND WITH CONTRAST  TECHNIQUE: Multi detector CT imaging of the abdomen and pelvis was performed following the standard protocol before and following the bolus administration of intravenous contrast.  CONTRAST: 115 mL OMNIPAQUE IOHEXOL 300 MG/ML SOLN  COMPARISON: No priors.  FINDINGS: Lower chest: Unremarkable.  Hepatobiliary: 9 mm low to intermediate attenuation lesion in segment 8 of the liver is too small to characterize, but favored to represent a small proteinaceous cyst. No other larger suspicious appearing cystic or solid hepatic lesion. No intra or extrahepatic biliary ductal dilatation. Status post cholecystectomy.  Pancreas: No pancreatic mass. No pancreatic ductal dilatation. No pancreatic or peripancreatic fluid or inflammatory changes.  Spleen: Unremarkable.  Adrenals/Urinary Tract: No calcifications identified within the collecting system of either kidney, along the course of either ureter, or within the lumen of  the urinary bladder. No hydroureteronephrosis or perinephric stranding to suggest urinary tract obstruction at this time. Linear calcifications in the renal hila bilaterally appear to be atherosclerotic. No suspicious renal lesions. Post contrast delayed images demonstrate no filling defect within the collecting system of either kidney, along the course of either ureter, or within the well opacified portion of the urinary bladder to strongly suggest the presence of a urothelial neoplasm at this time. Mild circumferential thickening of the urinary bladder wall, suggestive of potential bladder outlet obstruction. Bilateral adrenal glands are normal.  Stomach/Bowel: Normal appearance of the stomach. No pathologic dilatation of small bowel or colon. Numerous colonic diverticula are noted, particularly in the sigmoid colon, without surrounding inflammatory changes to suggest an acute diverticulitis at this time. Status post right hemicolectomy.  Vascular/Lymphatic: Atherosclerosis throughout the abdominal and pelvic vasculature, without  evidence of aneurysm. No lymphadenopathy noted in the abdomen or pelvis.  Reproductive: Prostate gland is markedly enlarged measuring 6.7 x 6.1 cm, with severe median lobe hypertrophy.  Other: No significant volume of ascites. No pneumoperitoneum.  Musculoskeletal: Status post right total hip arthroplasty. There are no aggressive appearing lytic or blastic lesions noted in the visualized portions of the skeleton.  IMPRESSION: 1. No explanation for the patient's history of microhematuria. Specifically, no urinary tract calculi. 2. However, the patient's prostate gland is markedly enlarged, with severe median lobe hypertrophy, as above. There appears to be some mild circumferential thickening of the urinary bladder wall, suggesting chronic bladder outlet obstruction. 3. Extensive atherosclerosis. 4. Colonic diverticulosis without evidence of  acute diverticulitis at this time. 5. Additional incidental findings, as above.   Electronically Signed  By: Elsworth Soho BM.D.  On: 12/25/2014 15:22  Assessment & Plan:    1. Microscopic hematuria:   Patient continues to have microscopic hematuria in his urine.  He denies any gross hematuria. I discussed with the patient the finding of the CT urogram. We reviewed his options, such as: postponing further workup until he experiences gross hematuria, undergoing cystoscopy in the office with urine cytology or undergoing cystoscopy with retrogrades with possible biopsy and possible ureteral stent in the OR. Patient would like to pursue a full workup and would like to be scheduled for cystoscopy with retrogrades.  I described to the patient how the procedure is performed and the risk associated with the procedure, such as: infection, bleeding, uncomfortableness for the first few days after the procedure, the possibility of a biopsy of an area of concern in the ureters or bladder and the possibility of a stent placement.  irritation of the bladder causing urinary frequency, blood in the urine, pain in the back when passing urine, stent displacement and/or infection. I also explained the risks of general anesthesia, such as: MI, CVA, paralysis, coma and/or death.  Patient is on Liquids for a. Fib and will need cardiac clearance prior to the procedure.    - Urinalysis, Complete  2. BPH with LUTS;   Patient's prostate was found to be markedly enlarged on CT Urogram.  IPSS score was 6/2 on 11/26/2014.  His last PSA was 2.9 ng/mL on 06/20/2014.  Patient will have a repeat IPSS score and DRE in one year.   No Follow-up on file.  Zara Council, [facility] [address], [place], [place] [address] [phone]

## 2015-01-31 ENCOUNTER — Telehealth: Payer: Self-pay

## 2015-01-31 NOTE — Telephone Encounter (Signed)
Pt called c/o passing clots. Nurse reinforced with pt this is ok, drink lots of water and to call if things get to where he cant urinate. Pt voiced understanding.

## 2015-04-29 ENCOUNTER — Ambulatory Visit (INDEPENDENT_AMBULATORY_CARE_PROVIDER_SITE_OTHER): Payer: Medicare Other | Admitting: Urology

## 2015-04-29 ENCOUNTER — Encounter: Payer: Self-pay | Admitting: Urology

## 2015-04-29 VITALS — BP 180/95 | HR 91 | Ht 70.0 in | Wt 165.2 lb

## 2015-04-29 DIAGNOSIS — R3129 Other microscopic hematuria: Secondary | ICD-10-CM | POA: Diagnosis not present

## 2015-04-29 DIAGNOSIS — N401 Enlarged prostate with lower urinary tract symptoms: Secondary | ICD-10-CM | POA: Diagnosis not present

## 2015-04-29 DIAGNOSIS — N138 Other obstructive and reflux uropathy: Secondary | ICD-10-CM

## 2015-04-29 LAB — MICROSCOPIC EXAMINATION
Bacteria, UA: NONE SEEN
Epithelial Cells (non renal): NONE SEEN /hpf (ref 0–10)
Renal Epithel, UA: NONE SEEN /hpf
WBC, UA: NONE SEEN /hpf (ref 0–?)

## 2015-04-29 LAB — URINALYSIS, COMPLETE
BILIRUBIN UA: NEGATIVE
Glucose, UA: NEGATIVE
KETONES UA: NEGATIVE
LEUKOCYTES UA: NEGATIVE
Nitrite, UA: NEGATIVE
PROTEIN UA: NEGATIVE
Specific Gravity, UA: 1.015 (ref 1.005–1.030)
UUROB: 0.2 mg/dL (ref 0.2–1.0)
pH, UA: 7 (ref 5.0–7.5)

## 2015-04-29 NOTE — Progress Notes (Signed)
04/29/2015 9:06 AM   Manuel Osborn [birth year] [id number]  Referring provider: Madelyn Brunner, MD [facility] [place], [place] [address]  Chief Complaint  Patient presents with  . Benign Prostatic Hypertrophy    3 month follow up after surgery    HPI: Patient is an 81 year old white male who has completed a AMH workup with CT Urogram and cystoscopy who presents today for a follow up.  CT Urogram was negative for GU malignancies and cystoscopy demonstrated a massively enlarged prostate with some bullous edema on the mucosal surface as well as friable prostatic vessels. Heavily trabeculated bladder. No bladder tumors or pathology. Unable to identify trigone given massively enlarged median lobe.    Patient states he feels well and has not had any gross hematuria.  His UA today is unremarkable.  His IPSS score is 1/0.  He is not having any dysuria or suprapubic pain.  He also denies any fevers, chills, nausea or vomiting.    He is hypertensive this morning, but he states it always runs high at the doctor's office.  He has an upcoming appointment with his cardiologist this week.   PMH: Past Medical History  Diagnosis Date  . Hyperlipidemia   . Shingles   . Hematuria, microscopic   . BPH (benign prostatic hyperplasia)   . Urinary tract infection   . Elevated PSA   . Hypertension   . Cancer ([place])     colon cancer 1988  . TIA (transient ischemic attack)   . Arthritis   . Atrial fibrillation [facility])     Surgical History: Past Surgical History  Procedure Laterality Date  . Total hip arthroplasty Right 1999  . Colon surgery  1980  . Joint replacement Right     Total Hip Replacement X 2  . Cholecystectomy    . Appendectomy    . Eye surgery Bilateral     Cataract Extraction  . Cystoscopy w/ retrogrades N/A 01/27/2015    Procedure: CYSTOSCOPY ;  Surgeon: Hollice Espy, MD;  Location: [facility] ORS;  Service: Urology;  Laterality: N/A;    Home  Medications:    Medication List       This list is accurate as of: 04/29/15  9:06 AM.  Always use your most recent med list.               acetaminophen 500 MG tablet  Commonly known as:  TYLENOL  Take 500 mg by mouth 2 (two) times daily.     diltiazem 120 MG 24 hr capsule  Commonly known as:  CARDIZEM CD  Take 120 mg by mouth daily.     ELIQUIS 5 MG Tabs tablet  Generic drug:  apixaban  Take 5 mg by mouth 2 (two) times daily.     finasteride 5 MG tablet  Commonly known as:  PROSCAR  Take 5 mg by mouth daily.     gabapentin 300 MG capsule  Commonly known as:  NEURONTIN  Take 300 mg by mouth 2 (two) times daily.     simvastatin 10 MG tablet  Commonly known as:  ZOCOR  TAKE ONE TABLET BY MOUTH AT BEDTIME.     tamsulosin 0.4 MG Caps capsule  Commonly known as:  FLOMAX  Take 0.4 mg by mouth daily.        Allergies:  Allergies  Allergen Reactions  . Diphenhydramine Other (See Comments)    "make hyper"    Family History: Family History  Problem Relation Age of Onset  . Prostate cancer Neg Hx   . Bladder Cancer Neg Hx   . Kidney disease Neg Hx     Social History:  reports that he has quit smoking. His smoking use included Cigarettes. He smoked 1.00 pack per day. He has never used smokeless tobacco. He reports that he does not drink alcohol or use illicit drugs.  ROS: UROLOGY Frequent Urination?: No Hard to postpone urination?: No Burning/pain with urination?: No Get up at night to urinate?: No Leakage of urine?: No Urine stream starts and stops?: No Trouble starting stream?: No Do you have to strain to urinate?: No Blood in urine?: No Urinary tract infection?: No Sexually transmitted disease?: No Injury to kidneys or bladder?: No Painful intercourse?: No Weak stream?: No Erection problems?: No Penile pain?: No  Gastrointestinal Nausea?: No Vomiting?: No Indigestion/heartburn?: No Diarrhea?: No Constipation?: No  Constitutional Fever:  No Night sweats?: No Weight loss?: No Fatigue?: No  Skin Skin rash/lesions?: No Itching?: No  Eyes Blurred vision?: No Double vision?: No  Ears/Nose/Throat Sore throat?: No Sinus problems?: No  Hematologic/Lymphatic Swollen glands?: No Easy bruising?: No  Cardiovascular Leg swelling?: No Chest pain?: No  Respiratory Cough?: No Shortness of breath?: No  Endocrine Excessive thirst?: No  Musculoskeletal Back pain?: No Joint pain?: No  Neurological Headaches?: No Dizziness?: No  Psychologic Depression?: No Anxiety?: No  Physical Exam: BP 180/95 mmHg  Pulse 91  Ht 5\' 10"  (1.778 m)  Wt 165 lb 3.2 oz (74.934 kg)  BMI 23.70 kg/m2  Constitutional: Well nourished. Alert and oriented, No acute distress. HEENT: [place] AT, moist mucus membranes. Trachea midline, no masses. Cardiovascular: No clubbing, cyanosis, or edema. Respiratory: Normal respiratory effort, no increased work of breathing. GI: Abdomen is soft, non tender, non distended, no abdominal masses. Liver and spleen not palpable.  No hernias appreciated.  Stool sample for occult testing is not indicated.   Skin: No rashes, bruises or suspicious lesions. Lymph: No cervical or inguinal adenopathy. Neurologic: Grossly intact, no focal deficits, moving all 4 extremities. Psychiatric: Normal mood and affect.  Laboratory Data: Lab Results  Component Value Date   WBC 8.3 01/22/2015   HGB 15.5 01/22/2015   HCT 45.1 01/22/2015   MCV 95.1 01/22/2015   PLT 119* 01/22/2015    Lab Results  Component Value Date   CREATININE 0.74 01/22/2015    Urinalysis Results for orders placed or performed in visit on 04/29/15  Microscopic Examination  Result Value Ref Range   WBC, UA None seen 0 -  5 /hpf   RBC, UA 0-2 0 -  2 /hpf   Epithelial Cells (non renal) None seen 0 - 10 /hpf   Renal Epithel, UA None seen None seen /hpf   Bacteria, UA None seen None seen/Few  Urinalysis, Complete  Result Value Ref Range    Specific Gravity, UA 1.015 1.005 - 1.030   pH, UA 7.0 5.0 - 7.5   Color, UA Yellow Yellow   Appearance Ur Clear Clear   Leukocytes, UA Negative Negative   Protein, UA Negative Negative/Trace   Glucose, UA Negative Negative   Ketones, UA Negative Negative   RBC, UA Trace (A) Negative   Bilirubin, UA Negative Negative   Urobilinogen, Ur 0.2 0.2 - 1.0 mg/dL   Nitrite, UA Negative Negative   Microscopic Examination See below:      Assessment & Plan:    1. AMH:   Patient has completed his hematuria workup with CT urogram  and cystoscopy. No GU malignancies were found. His UA was unremarkable today. He has not had any recent gross hematuria. Patient returned to the office in 1 year for follow-up urinalysis. He will contact our office if he should develop any gross hematuria.  - Urinalysis, Complete  2. BPH (benign prostatic hyperplasia) with LUTS:   Patient's I PSS score today is 1/0.  He is currently taking tamsulosin 0.4 mg and finasteride 5 mg daily.  He will continue these medications.  He will return in 1 year for I PSS score and exam.  Return in about 1 year (around 04/28/2016) for IPSS, UA and exam.  Zara Council, [facility]  [place] [address], [place], [place] [address] [phone]

## 2015-06-02 ENCOUNTER — Ambulatory Visit: Payer: Self-pay | Admitting: Urology

## 2015-12-08 ENCOUNTER — Other Ambulatory Visit: Payer: Self-pay | Admitting: Urology

## 2016-01-07 ENCOUNTER — Other Ambulatory Visit: Payer: Self-pay | Admitting: Urology

## 2016-02-06 ENCOUNTER — Other Ambulatory Visit: Payer: Self-pay | Admitting: Urology

## 2016-03-09 ENCOUNTER — Other Ambulatory Visit: Payer: Self-pay | Admitting: Urology

## 2016-04-08 ENCOUNTER — Other Ambulatory Visit: Payer: Self-pay | Admitting: Urology

## 2016-04-29 ENCOUNTER — Encounter: Payer: Self-pay | Admitting: Urology

## 2016-04-29 ENCOUNTER — Ambulatory Visit: Payer: Medicare Other | Admitting: Urology

## 2016-04-29 VITALS — BP 175/98 | HR 94 | Ht 70.0 in | Wt 162.1 lb

## 2016-04-29 DIAGNOSIS — N400 Enlarged prostate without lower urinary tract symptoms: Secondary | ICD-10-CM

## 2016-04-29 DIAGNOSIS — R3129 Other microscopic hematuria: Secondary | ICD-10-CM | POA: Diagnosis not present

## 2016-04-29 LAB — URINALYSIS, COMPLETE
BILIRUBIN UA: NEGATIVE
GLUCOSE, UA: NEGATIVE
Ketones, UA: NEGATIVE
Leukocytes, UA: NEGATIVE
Nitrite, UA: NEGATIVE
PROTEIN UA: NEGATIVE
Specific Gravity, UA: 1.015 (ref 1.005–1.030)
Urobilinogen, Ur: 0.2 mg/dL (ref 0.2–1.0)
pH, UA: 7.5 (ref 5.0–7.5)

## 2016-04-29 LAB — MICROSCOPIC EXAMINATION
Bacteria, UA: NONE SEEN
Epithelial Cells (non renal): NONE SEEN /hpf (ref 0–10)

## 2016-04-29 MED ORDER — TAMSULOSIN HCL 0.4 MG PO CAPS: ORAL_CAPSULE | ORAL | 12 refills | Status: DC

## 2016-04-29 MED ORDER — FINASTERIDE 5 MG PO TABS: 5.0000 mg | ORAL_TABLET | Freq: Every day | ORAL | 12 refills | Status: DC

## 2016-04-29 NOTE — Progress Notes (Signed)
04/29/2016 9:45 AM   Manuel Osborn Apr 21, 1927 [id number]  Referring provider: Madelyn Brunner, MD [facility] [place], [place] [address]  Chief Complaint  Patient presents with  . Benign Prostatic Hypertrophy    1 year follow up  . Hematuria    microscopic    HPI: Patient is an 82 year old Caucasian male who presents today for a one year follow up for a hematuria and BPH with LUTS.  Hematuria Patient completed a AMH workup with CT Urogram and cystoscopy in 2016.  CT Urogram was negative for GU malignancies and cystoscopy demonstrated a massively enlarged prostate with some bullous edema on the mucosal surface as well as friable prostatic vessels. Heavily trabeculated bladder. No bladder tumors or pathology. Unable to identify trigone given massively enlarged median lobe.   Patient states he feels well and has not had any gross hematuria.  His UA today has 3-10 RBC's.    BPH WITH LUTS His IPSS score today is 1, which is mild lower urinary tract symptomatology.  He is pleased with his quality life due to his urinary symptoms. His previous IPSS score was 1/0.   He denies any dysuria, hematuria or suprapubic pain.   He currently taking tamsulosin 0.4 mg and finasteride 5 mg daily.   He also denies any recent fevers, chills, nausea or vomiting.  He does not have a family history of PCa.      IPSS    Row Name 04/29/16 0800         International Prostate Symptom Score   How often have you had the sensation of not emptying your bladder? Not at All     How often have you had to urinate less than every two hours? Not at All     How often have you found you stopped and started again several times when you urinated? Not at All     How often have you found it difficult to postpone urination? Not at All     How often have you had a weak urinary stream? Not at All     How often have you had to strain to start urination? Not at All     How many times did  you typically get up at night to urinate? 1 Time     Total IPSS Score 1       Quality of Life due to urinary symptoms   If you were to spend the rest of your life with your urinary condition just the way it is now how would you feel about that? Pleased        Score:  1-7 Mild 8-19 Moderate 20-35 Severe   His IPSS score is 1/0.  He is not having any dysuria or suprapubic pain.  He also denies any fevers, chills, nausea or vomiting.    He is hypertensive this morning, but he states it always runs high at the doctor's office.  He has an upcoming appointment with his cardiologist this week.   PMH: Past Medical History:  Diagnosis Date  . Arthritis   . Atrial fibrillation ([place])   . BPH (benign prostatic hyperplasia)   . Cancer ([place])    colon cancer 1988  . Elevated PSA   . Hematuria, microscopic   . Hyperlipidemia   . Hypertension   . Macular degeneration   . Shingles   . TIA (transient ischemic attack)   . Urinary tract infection  Surgical History: Past Surgical History:  Procedure Laterality Date  . APPENDECTOMY    . CHOLECYSTECTOMY    . COLON SURGERY  1980  . CYSTOSCOPY W/ RETROGRADES N/A 01/27/2015   Procedure: CYSTOSCOPY ;  Surgeon: Hollice Espy, MD;  Location: [facility] ORS;  Service: Urology;  Laterality: N/A;  . EYE SURGERY Bilateral    Cataract Extraction  . JOINT REPLACEMENT Right    Total Hip Replacement X 2  . TOTAL HIP ARTHROPLASTY Right 1999    Home Medications:    Medication List       Accurate as of 04/29/16  9:45 AM. Always use your most recent med list.          acetaminophen 500 MG tablet Commonly known as:  TYLENOL Take 500 mg by mouth 2 (two) times daily.   diltiazem 120 MG 24 hr capsule Commonly known as:  CARDIZEM CD Take 120 mg by mouth daily.   ELIQUIS 5 MG Tabs tablet Generic drug:  apixaban Take 5 mg by mouth 2 (two) times daily.   finasteride 5 MG tablet Commonly known as:  PROSCAR Take 1 tablet (5 mg total) by mouth  daily.   gabapentin 300 MG capsule Commonly known as:  NEURONTIN Take 300 mg by mouth 2 (two) times daily.   MULTIVITAMIN ADULT PO Take by mouth.   simvastatin 10 MG tablet Commonly known as:  ZOCOR TAKE ONE TABLET BY MOUTH AT BEDTIME.   sodium chloride 2 % ophthalmic solution Commonly known as:  MURO 128 Place 1 drop into both eyes 2 (two) times daily.   tamsulosin 0.4 MG Caps capsule Commonly known as:  FLOMAX TAKE (1) CAPSULE BY MOUTH ONCE DAILY.       Allergies:  Allergies  Allergen Reactions  . Diphenhydramine Other (See Comments)    "make hyper"    Family History: Family History  Problem Relation Age of Onset  . Prostate cancer Neg Hx   . Bladder Cancer Neg Hx   . Kidney disease Neg Hx     Social History:  reports that he has quit smoking. His smoking use included Cigarettes. He smoked 1.00 pack per day. He has never used smokeless tobacco. He reports that he does not drink alcohol or use drugs.  ROS: UROLOGY Frequent Urination?: No Hard to postpone urination?: No Burning/pain with urination?: No Get up at night to urinate?: No Leakage of urine?: No Urine stream starts and stops?: No Trouble starting stream?: No Do you have to strain to urinate?: No Blood in urine?: No Urinary tract infection?: No Sexually transmitted disease?: No Injury to kidneys or bladder?: No Painful intercourse?: No Weak stream?: No Erection problems?: No Penile pain?: No  Gastrointestinal Nausea?: No Vomiting?: No Indigestion/heartburn?: No Diarrhea?: No Constipation?: No  Constitutional Fever: No Night sweats?: No Weight loss?: No Fatigue?: No  Skin Skin rash/lesions?: No Itching?: No  Eyes Blurred vision?: No Double vision?: No  Ears/Nose/Throat Sore throat?: No Sinus problems?: No  Hematologic/Lymphatic Swollen glands?: No Easy bruising?: No  Cardiovascular Leg swelling?: No Chest pain?: No  Respiratory Cough?: No Shortness of breath?:  No  Endocrine Excessive thirst?: No  Musculoskeletal Back pain?: No Joint pain?: No  Neurological Headaches?: No Dizziness?: No  Psychologic Depression?: No Anxiety?: No  Physical Exam: BP (!) 175/98   Pulse 94   Ht 5\' 10"  (1.778 m)   Wt 162 lb 1.6 oz (73.5 kg)   BMI 23.26 kg/m   Constitutional: Well nourished. Alert and oriented, No acute distress. HEENT:  [place] AT, moist mucus membranes. Trachea midline, no masses. Cardiovascular: No clubbing, cyanosis, or edema. Respiratory: Normal respiratory effort, no increased work of breathing. GI: Abdomen is soft, non tender, non distended, no abdominal masses. Liver and spleen not palpable.  No hernias appreciated.  Stool sample for occult testing is not indicated.   GU: No CVA tenderness.  No bladder fullness or masses.  Patient with uncircumcised phallus.  Foreskin easily retracted  Urethral meatus is patent.  No penile discharge. No penile lesions or rashes. Scrotum without lesions, cysts, rashes and/or edema.  Testicles are located scrotally bilaterally. No masses are appreciated in the testicles. Left and right epididymis are normal. Rectal: Patient with  normal sphincter tone. Anus and perineum without scarring or rashes. No rectal masses are appreciated. Prostate is approximately 60 grams, no nodules are appreciated. Seminal vesicles are normal. Skin: No rashes, bruises or suspicious lesions. Lymph: No cervical or inguinal adenopathy. Neurologic: Grossly intact, no focal deficits, moving all 4 extremities. Psychiatric: Normal mood and affect.  Laboratory Data: Lab Results  Component Value Date   WBC 8.3 01/22/2015   HGB 15.5 01/22/2015   HCT 45.1 01/22/2015   MCV 95.1 01/22/2015   PLT 119 (L) 01/22/2015    Lab Results  Component Value Date   CREATININE 0.74 01/22/2015    Urinalysis Significant for 3-10 RBC's.  See EPIC.    Assessment & Plan:    1. AMH:   Patient has completed his hematuria workup with CT urogram  and cystoscopy. No GU malignancies were found. His UA was unremarkable today. He has not had any recent gross hematuria.   UA today demonstrated 3-10 RBC's/hpf.  Patient returned to the office in 1 year for follow-up urinalysis. He will contact our office if he should develop any gross hematuria. - Urinalysis, Complete  2. BPH (benign prostatic hyperplasia) with LUTS:   Patient's I PSS score today is 1/0.  He is currently taking tamsulosin 0.4 mg and finasteride 5 mg daily.  He will continue these medications.  He will return in 1 year for I PSS score and exam.  Return in about 1 year (around 04/29/2017) for IPSS, UA and exam.  Zara Council, [facility]  [place] [address] [place], [place] [address] [phone]

## 2016-05-31 IMAGING — CT CT ABD-PEL WO/W CM
2 of 10 series · 10 of 46 positions shown, 16 images · IV contrast (omnipaque)
Comparison: No priors.

CLINICAL DATA: 88-year-old male with microhematuria. Remote history
of colon cancer in 1766 status post partial colectomy.

EXAM:
CT ABDOMEN AND PELVIS WITHOUT AND WITH CONTRAST
TECHNIQUE: Multidetector CT imaging of the abdomen and pelvis was performed
following the standard protocol before and following the bolus
administration of intravenous contrast.
CONTRAST:  115mL OMNIPAQUE IOHEXOL 300 MG/ML  SOLN

[Series 7: cor hematuria > 45 wo · coronal · 0.71mm/px · 2 of 138 slices shown, 3 images]
[im 46/138  soft-tissue]
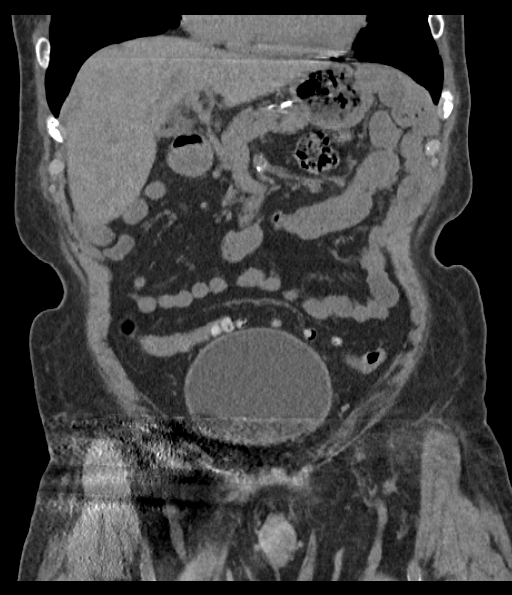
[im 46/138  bone]
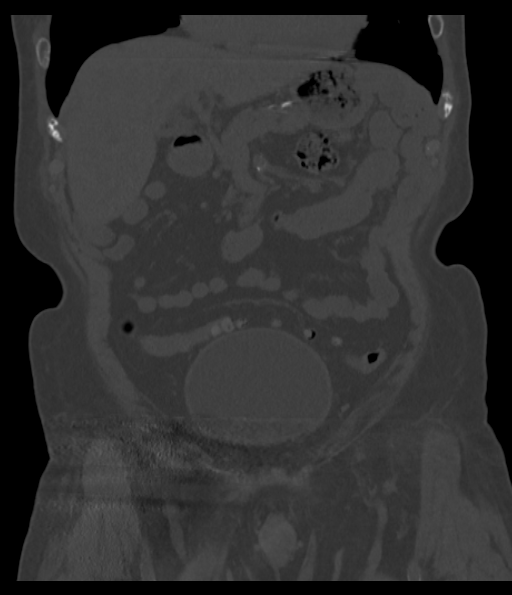
[im 92/138  soft-tissue]
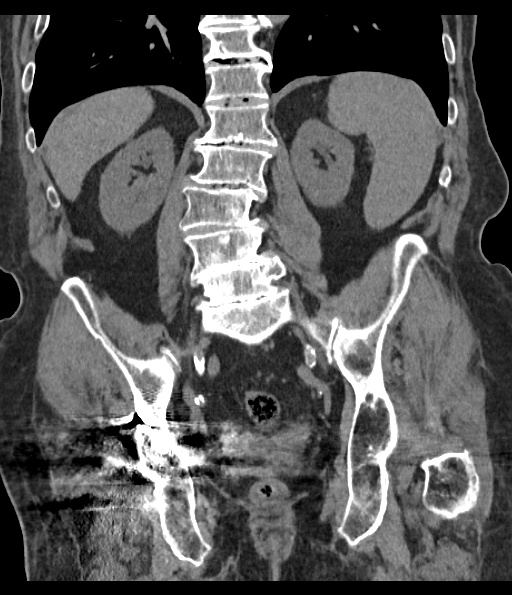

[Series 16: delay. · axial · delayed · 0.72mm/px · z∈[-1104,-764]mm · 8 of 88 slices shown, 13 images]
[im 10/88  soft-tissue]
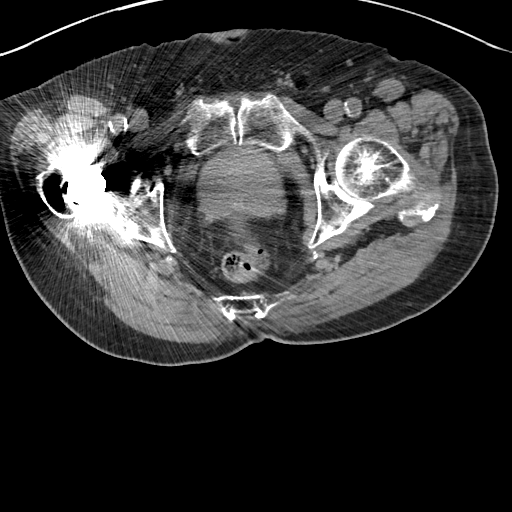
[im 10/88  bone]
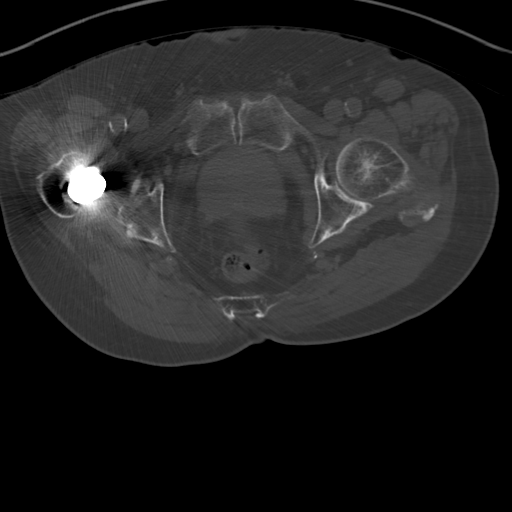
[im 20/88  soft-tissue]
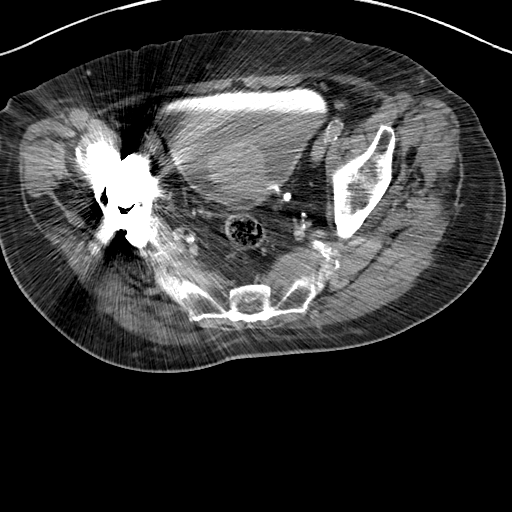
[im 30/88  soft-tissue]
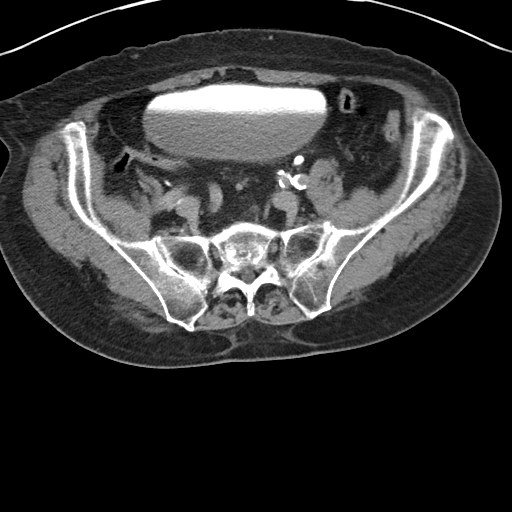
[im 39/88  soft-tissue]
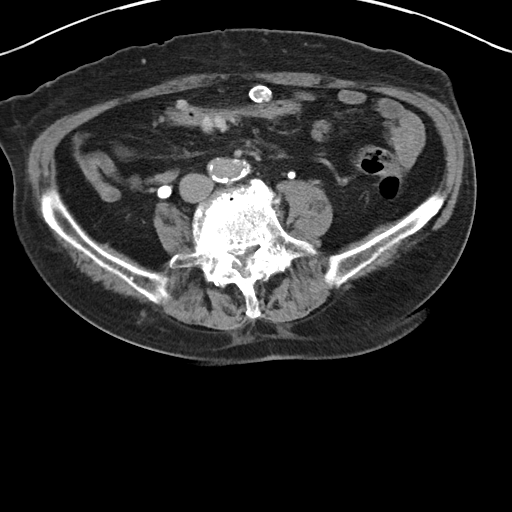
[im 49/88  soft-tissue]
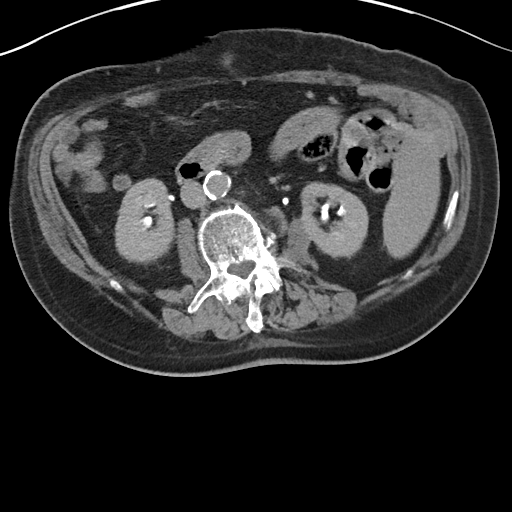
[im 49/88  lung]
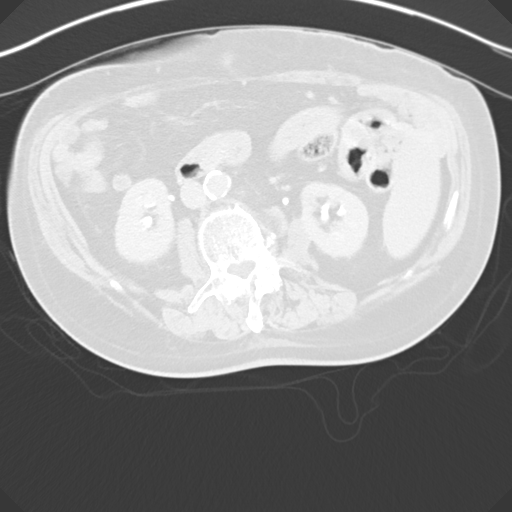
[im 59/88  soft-tissue]
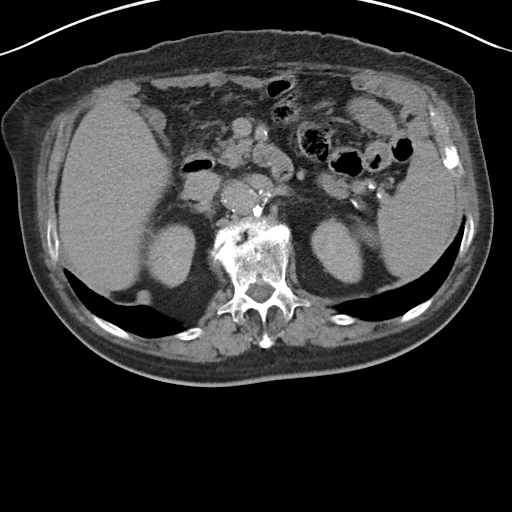
[im 59/88  lung]
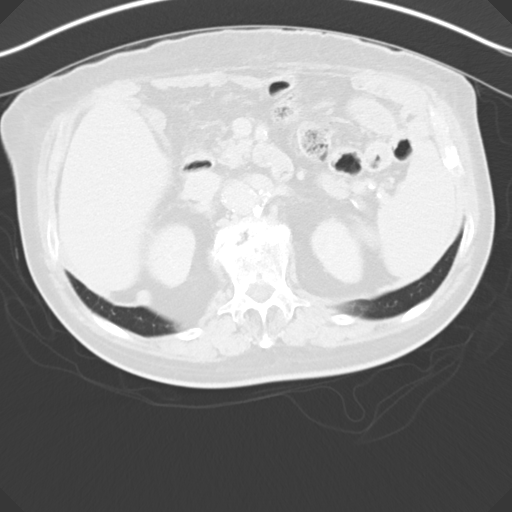
[im 68/88  soft-tissue]
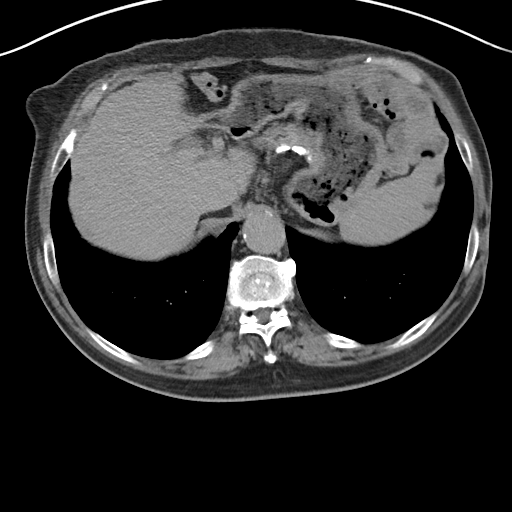
[im 68/88  lung]
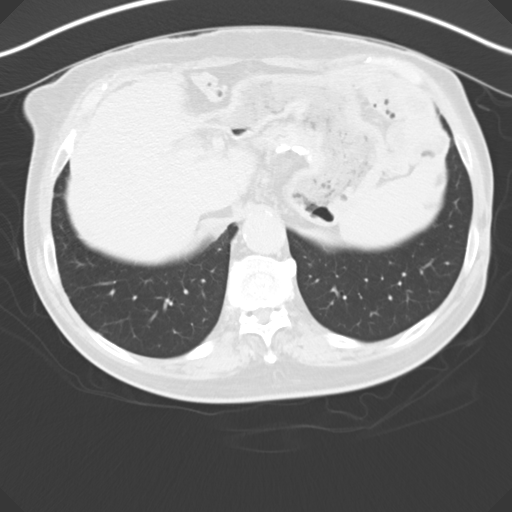
[im 78/88  soft-tissue]
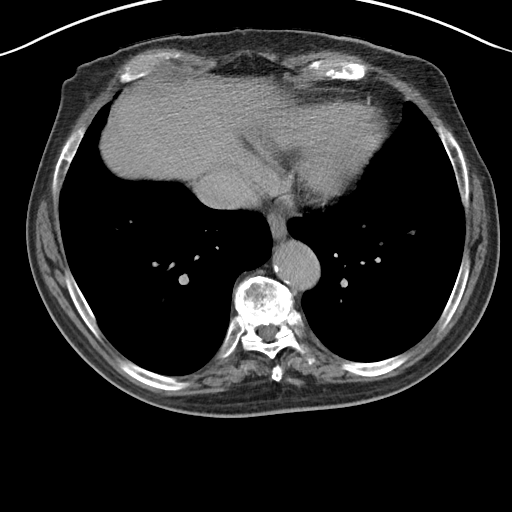
[im 78/88  lung]
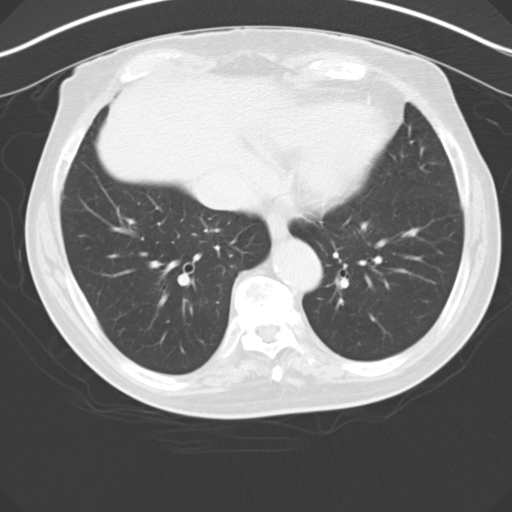

[10 of 46 positions shown; findings below may reference images not displayed]

FINDINGS: Lower chest:  Unremarkable.

Hepatobiliary: 9 mm low to intermediate attenuation lesion in
segment 8 of the liver is too small to characterize, but favored to
represent a small proteinaceous cyst. No other larger suspicious
appearing cystic or solid hepatic lesion. No intra or extrahepatic
biliary ductal dilatation. Status post cholecystectomy.

Pancreas: No pancreatic mass. No pancreatic ductal dilatation. No
pancreatic or peripancreatic fluid or inflammatory changes.

Spleen: Unremarkable.

Adrenals/Urinary Tract: No calcifications identified within the
collecting system of either kidney, along the course of either
ureter, or within the lumen of the urinary bladder. No
hydroureteronephrosis or perinephric stranding to suggest urinary
tract obstruction at this time. Linear calcifications in the renal
hila bilaterally appear to be atherosclerotic. No suspicious renal
lesions. Postcontrast delayed images demonstrate no filling defect
within the collecting system of either kidney, along the course of
either ureter, or within the well opacified portion of the urinary
bladder to strongly suggest the presence of a urothelial neoplasm at
this time. Mild circumferential thickening of the urinary bladder
wall, suggestive of potential bladder outlet obstruction. Bilateral
adrenal glands are normal.

Stomach/Bowel: Normal appearance of the stomach. No pathologic
dilatation of small bowel or colon. Numerous colonic diverticulae
are noted, particularly in the sigmoid colon, without surrounding
inflammatory changes to suggest an acute diverticulitis at this
time. Status post right hemicolectomy.

Vascular/Lymphatic: Atherosclerosis throughout the abdominal and
pelvic vasculature, without evidence of aneurysm. No lymphadenopathy
noted in the abdomen or pelvis.

Reproductive: Prostate gland is markedly enlarged measuring 6.7 x
6.1 cm, with severe median lobe hypertrophy.

Other: No significant volume of ascites.  No pneumoperitoneum.

Musculoskeletal: Status post right total hip arthroplasty. There are
no aggressive appearing lytic or blastic lesions noted in the
visualized portions of the skeleton.
IMPRESSION: 1. No explanation for the patient's history of microhematuria.
Specifically, no urinary tract calculi.
2. However, the patient's prostate gland is markedly enlarged, with
severe median lobe hypertrophy, as above. There appears to be some
mild circumferential thickening of the urinary bladder wall,
suggesting chronic bladder outlet obstruction.
3. Extensive atherosclerosis.
4. Colonic diverticulosis without evidence of acute diverticulitis
at this time.
5. Additional incidental findings, as above.

## 2017-04-26 NOTE — Progress Notes (Signed)
04/28/2017 8:40 AM   Manuel Osborn March 18, 1927 [id number]  Referring provider: Madelyn Brunner, MD [facility] [place], [place] [address]  Chief Complaint  Patient presents with  . Follow-up  . Benign Prostatic Hypertrophy    HPI: Patient is an 83 year old Caucasian male who presents today for a one year follow up for a history of hematuria and BPH with LUTS.  History of hematuria Patient completed a AMH workup with CT Urogram and cystoscopy in 2016.  CT Urogram was negative for GU malignancies and cystoscopy demonstrated a massively enlarged prostate with some bullous edema on the mucosal surface as well as friable prostatic vessels. Heavily trabeculated bladder. No bladder tumors or pathology. Unable to identify trigone given massively enlarged median lobe.  Patient states he feels well and has not had any gross hematuria.    BPH WITH LUTS His IPSS score today is 0, which is no lower urinary tract symptomatology.  He is delighted with his quality life due to his urinary symptoms. His previous IPSS score was 1/0.   He denies any dysuria, hematuria or suprapubic pain.   He currently taking tamsulosin 0.4 mg and finasteride 5 mg daily.   He also denies any recent fevers, chills, nausea or vomiting.  He does not have a family history of PCa.      IPSS    Row Name 04/28/17 0800         International Prostate Symptom Score   How often have you had the sensation of not emptying your bladder? Not at All     How often have you had to urinate less than every two hours? Not at All     How often have you found you stopped and started again several times when you urinated? Not at All     How often have you found it difficult to postpone urination? Not at All     How often have you had a weak urinary stream? Not at All     How often have you had to strain to start urination? Not at All     How many times did you typically get up at night to urinate? None      Total IPSS Score 0       Quality of Life due to urinary symptoms   If you were to spend the rest of your life with your urinary condition just the way it is now how would you feel about that? Delighted        Score:  1-7 Mild 8-19 Moderate 20-35 Severe   PMH: Past Medical History:  Diagnosis Date  . Arthritis   . Atrial fibrillation ([place])   . BPH (benign prostatic hyperplasia)   . Cancer ([place])    colon cancer 1988  . Elevated PSA   . Hematuria, microscopic   . Hyperlipidemia   . Hypertension   . Macular degeneration   . Shingles   . TIA (transient ischemic attack)   . Urinary tract infection     Surgical History: Past Surgical History:  Procedure Laterality Date  . APPENDECTOMY    . CHOLECYSTECTOMY    . COLON SURGERY  1980  . CYSTOSCOPY W/ RETROGRADES N/A 01/27/2015   Procedure: CYSTOSCOPY ;  Surgeon: Hollice Espy, MD;  Location: [facility] ORS;  Service: Urology;  Laterality: N/A;  . EYE SURGERY Bilateral    Cataract Extraction  . JOINT REPLACEMENT Right    Total Hip Replacement  X 2  . TOTAL HIP ARTHROPLASTY Right 1999    Home Medications:  Allergies as of 04/28/2017      Reactions   Diphenhydramine Other (See Comments)   "make hyper"      Medication List       Accurate as of 04/28/17  8:40 AM. Always use your most recent med list.          acetaminophen 500 MG tablet Commonly known as:  TYLENOL Take 500 mg by mouth 2 (two) times daily.   diltiazem 120 MG 24 hr capsule Commonly known as:  CARDIZEM CD Take 120 mg by mouth daily.   diltiazem 180 MG 24 hr capsule Commonly known as:  CARDIZEM CD TAKE (1) CAPSULE BY MOUTH ONCE DAILY.   ELIQUIS 5 MG Tabs tablet Generic drug:  apixaban Take 5 mg by mouth 2 (two) times daily.   ELIQUIS 5 MG Tabs tablet Generic drug:  apixaban TAKE 1 TABLET BY MOUTH TWICE DAILY   finasteride 5 MG tablet Commonly known as:  PROSCAR Take 1 tablet (5 mg total) by mouth daily.   gabapentin 300 MG  capsule Commonly known as:  NEURONTIN Take 300 mg by mouth 2 (two) times daily.   MULTIVITAMIN ADULT PO Take by mouth.   simvastatin 10 MG tablet Commonly known as:  ZOCOR TAKE ONE TABLET BY MOUTH AT BEDTIME.   sodium chloride 2 % ophthalmic solution Commonly known as:  MURO 128 Place 1 drop into both eyes 2 (two) times daily.   tamsulosin 0.4 MG Caps capsule Commonly known as:  FLOMAX TAKE (1) CAPSULE BY MOUTH ONCE DAILY.       Allergies:  Allergies  Allergen Reactions  . Diphenhydramine Other (See Comments)    "make hyper"    Family History: Family History  Problem Relation Age of Onset  . Prostate cancer Neg Hx   . Bladder Cancer Neg Hx   . Kidney disease Neg Hx     Social History:  reports that he has quit smoking. His smoking use included Cigarettes. He smoked 1.00 pack per day. He has never used smokeless tobacco. He reports that he does not drink alcohol or use drugs.  ROS: UROLOGY Frequent Urination?: No Hard to postpone urination?: No Burning/pain with urination?: No Get up at night to urinate?: No Leakage of urine?: No Urine stream starts and stops?: No Trouble starting stream?: No Do you have to strain to urinate?: No Blood in urine?: No Urinary tract infection?: No Sexually transmitted disease?: No Injury to kidneys or bladder?: No Painful intercourse?: No Weak stream?: No Erection problems?: No Penile pain?: No  Gastrointestinal Nausea?: No Vomiting?: No Indigestion/heartburn?: No Diarrhea?: No Constipation?: No  Constitutional Fever: No Night sweats?: No Weight loss?: No Fatigue?: No  Skin Skin rash/lesions?: No Itching?: No  Eyes Blurred vision?: No Double vision?: No  Ears/Nose/Throat Sore throat?: No Sinus problems?: No  Hematologic/Lymphatic Swollen glands?: No Easy bruising?: No  Cardiovascular Leg swelling?: No Chest pain?: No  Respiratory Cough?: No Shortness of breath?: No  Endocrine Excessive  thirst?: No  Musculoskeletal Back pain?: No Joint pain?: No  Neurological Headaches?: No Dizziness?: No  Psychologic Depression?: No Anxiety?: No  Physical Exam: BP (!) 188/96   Pulse 96   Ht 5\' 10"  (1.778 m)   Wt 155 lb (70.3 kg)   BMI 22.24 kg/m   Constitutional: Well nourished. Alert and oriented, No acute distress. HEENT: [place] AT, moist mucus membranes. Trachea midline, no masses. Cardiovascular: No clubbing, cyanosis,  or edema. Respiratory: Normal respiratory effort, no increased work of breathing. GI: Abdomen is soft, non tender, non distended, no abdominal masses. Liver and spleen not palpable.  No hernias appreciated.  Stool sample for occult testing is not indicated.   GU: No CVA tenderness.  No bladder fullness or masses.  Patient with uncircumcised phallus.  Foreskin easily retracted  Urethral meatus is patent.  No penile discharge. No penile lesions or rashes. Scrotum without lesions, cysts, rashes and/or edema.  Testicles are located scrotally bilaterally. No masses are appreciated in the testicles. Left and right epididymis are normal. Rectal: Patient with  normal sphincter tone. Anus and perineum without scarring or rashes. No rectal masses are appreciated. Prostate is approximately 60 grams, no nodules are appreciated. Seminal vesicles are normal. Skin: No rashes, bruises or suspicious lesions. Lymph: No cervical or inguinal adenopathy. Neurologic: Grossly intact, no focal deficits, moving all 4 extremities. Psychiatric: Normal mood and affect.  Assessment & Plan:    1. History of hematuria  - Completed his hematuria workup with CT urogram and cystoscopy in 2016 - findings positive for massively enlarged prostate with some bullous edema on the mucosal surface as well as friable prostatic vessels. Heavily trabeculated bladder. No bladder tumors or pathology. Unable to identify trigone given massively enlarged median lobe.  No gross hematuria.    2. BPH (benign  prostatic hyperplasia) with LUTS:   Patient's I PSS score today is 0/0.  It is improved.  He is currently taking tamsulosin 0.4 mg and finasteride 5 mg daily.  He will continue these medications.  He will return in 1 year for I PSS score and exam.  Return in about 1 year (around 04/28/2018) for I PSS and exam.  Zara Council, [facility]  [place] [address] [place], [place] [address] [phone]

## 2017-04-28 ENCOUNTER — Ambulatory Visit: Payer: Medicare Other | Admitting: Urology

## 2017-04-28 ENCOUNTER — Encounter: Payer: Self-pay | Admitting: Urology

## 2017-04-28 VITALS — BP 188/96 | HR 96 | Ht 70.0 in | Wt 155.0 lb

## 2017-04-28 DIAGNOSIS — N401 Enlarged prostate with lower urinary tract symptoms: Secondary | ICD-10-CM | POA: Diagnosis not present

## 2017-04-28 DIAGNOSIS — Z87448 Personal history of other diseases of urinary system: Secondary | ICD-10-CM

## 2017-04-28 DIAGNOSIS — N138 Other obstructive and reflux uropathy: Secondary | ICD-10-CM

## 2017-04-28 MED ORDER — TAMSULOSIN HCL 0.4 MG PO CAPS: ORAL_CAPSULE | ORAL | 12 refills | Status: AC

## 2017-04-28 MED ORDER — FINASTERIDE 5 MG PO TABS: 5.0000 mg | ORAL_TABLET | Freq: Every day | ORAL | 12 refills | Status: AC

## 2018-04-26 ENCOUNTER — Ambulatory Visit: Payer: Medicare Other | Admitting: Urology

## 2024-03-05 DEATH — deceased
